# Patient Record
Sex: Female | Born: 1990 | Race: White | Hispanic: No | Marital: Married | Smoking: Current every day smoker
Health system: Southern US, Community
[De-identification: ages and names within clinical notes are randomized; demographics above are authoritative.]

## PROBLEM LIST (undated history)

## (undated) DIAGNOSIS — I10 Essential (primary) hypertension: Secondary | ICD-10-CM

## (undated) DIAGNOSIS — M719 Bursopathy, unspecified: Secondary | ICD-10-CM

## (undated) DIAGNOSIS — M1990 Unspecified osteoarthritis, unspecified site: Secondary | ICD-10-CM

## (undated) DIAGNOSIS — I4581 Long QT syndrome: Secondary | ICD-10-CM

## (undated) HISTORY — PX: CHOLECYSTECTOMY: SHX55

---
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
## 2009-02-05 ENCOUNTER — Emergency Department: Payer: Self-pay | Admitting: Emergency Medicine

## 2009-05-25 ENCOUNTER — Emergency Department: Payer: Self-pay | Admitting: Emergency Medicine

## 2009-07-12 ENCOUNTER — Emergency Department: Payer: Self-pay | Admitting: Emergency Medicine

## 2009-07-22 ENCOUNTER — Ambulatory Visit: Payer: Self-pay | Admitting: Cardiovascular Disease

## 2010-01-25 ENCOUNTER — Emergency Department: Payer: Self-pay | Admitting: Unknown Physician Specialty

## 2010-03-05 ENCOUNTER — Emergency Department: Payer: Self-pay | Admitting: Unknown Physician Specialty

## 2010-06-11 ENCOUNTER — Emergency Department: Payer: Self-pay | Admitting: Emergency Medicine

## 2010-11-30 ENCOUNTER — Emergency Department: Payer: Self-pay | Admitting: *Deleted

## 2010-12-09 ENCOUNTER — Emergency Department: Payer: Self-pay | Admitting: *Deleted

## 2011-05-25 ENCOUNTER — Emergency Department: Payer: Self-pay | Admitting: Emergency Medicine

## 2017-06-26 ENCOUNTER — Emergency Department (HOSPITAL_COMMUNITY): Payer: BLUE CROSS/BLUE SHIELD

## 2017-06-26 ENCOUNTER — Other Ambulatory Visit: Payer: Self-pay

## 2017-06-26 ENCOUNTER — Emergency Department (HOSPITAL_COMMUNITY)
Admission: EM | Admit: 2017-06-26 | Discharge: 2017-06-26 | Disposition: A | Discharge status: Home / Self Care | Payer: BLUE CROSS/BLUE SHIELD | Attending: Emergency Medicine | Admitting: Emergency Medicine

## 2017-06-26 ENCOUNTER — Encounter (HOSPITAL_COMMUNITY): Payer: Self-pay | Admitting: Emergency Medicine

## 2017-06-26 DIAGNOSIS — I10 Essential (primary) hypertension: Secondary | ICD-10-CM | POA: Diagnosis not present

## 2017-06-26 DIAGNOSIS — R1084 Generalized abdominal pain: Secondary | ICD-10-CM | POA: Diagnosis present

## 2017-06-26 DIAGNOSIS — R109 Unspecified abdominal pain: Secondary | ICD-10-CM

## 2017-06-26 DIAGNOSIS — F17210 Nicotine dependence, cigarettes, uncomplicated: Secondary | ICD-10-CM | POA: Insufficient documentation

## 2017-06-26 HISTORY — DX: Essential (primary) hypertension: I10

## 2017-06-26 HISTORY — DX: Bursopathy, unspecified: M71.9

## 2017-06-26 HISTORY — DX: Unspecified osteoarthritis, unspecified site: M19.90

## 2017-06-26 HISTORY — DX: Long QT syndrome: I45.81

## 2017-06-26 LAB — URINALYSIS, ROUTINE W REFLEX MICROSCOPIC
Bilirubin Urine: NEGATIVE
GLUCOSE, UA: NEGATIVE mg/dL
Hgb urine dipstick: NEGATIVE
Ketones, ur: NEGATIVE mg/dL
LEUKOCYTES UA: NEGATIVE
NITRITE: NEGATIVE
Protein, ur: NEGATIVE mg/dL
SPECIFIC GRAVITY, URINE: 1.021 (ref 1.005–1.030)
pH: 6 (ref 5.0–8.0)

## 2017-06-26 LAB — BASIC METABOLIC PANEL
ANION GAP: 9 (ref 5–15)
BUN: 11 mg/dL (ref 6–20)
CALCIUM: 9.2 mg/dL (ref 8.9–10.3)
CO2: 24 mmol/L (ref 22–32)
Chloride: 104 mmol/L (ref 101–111)
Creatinine, Ser: 0.61 mg/dL (ref 0.44–1.00)
GFR calc Af Amer: >60 mL/min (ref >60)
GLUCOSE: 102 mg/dL — AB (ref 65–99)
POTASSIUM: 4.1 mmol/L (ref 3.5–5.1)
SODIUM: 137 mmol/L (ref 135–145)

## 2017-06-26 LAB — PREGNANCY, URINE: Preg Test, Ur: NEGATIVE

## 2017-06-26 MED ORDER — HYDROMORPHONE HCL 1 MG/ML IJ SOLN
0.5000 mg | Freq: Once | INTRAMUSCULAR | Status: AC
Start: 2017-06-26 — End: 2017-06-26
  Administered 2017-06-26: 0.5 mg via INTRAVENOUS
  Filled 2017-06-26: qty 1

## 2017-06-26 MED ORDER — IOPAMIDOL (ISOVUE-300) INJECTION 61%
100.0000 mL | Freq: Once | INTRAVENOUS | Status: AC | PRN
  Administered 2017-06-26: 100 mL via INTRAVENOUS

## 2017-06-26 MED ORDER — ONDANSETRON HCL 4 MG/2ML IJ SOLN
4.0000 mg | Freq: Once | INTRAMUSCULAR | Status: AC
  Administered 2017-06-26: 4 mg via INTRAVENOUS
  Filled 2017-06-26: qty 2

## 2017-06-26 NOTE — ED Triage Notes (Signed)
Pt reports bilateral flank pain since this am. Pt reports pain started after initiating "doxepin". Pt reports has recently had kidney function test and reports abnormal sodium and chloride. Pt denies any abd pain, fever, n/v/d.

## 2017-06-26 NOTE — Discharge Instructions (Signed)
Your vital signs are within normal limits.  Your oxygen level is 100% on room air.  Within normal limits by my interpretation.  Your urine analysis and urine pregnancy test are negative.  Your CT scan of the abdomen and pelvis are negative for acute problems.  Please discuss your flank pain with your primary physician as well as your rheumatology specialist.  Heating pad may be helpful.  Please return to the emergency department if any emergent changes in your condition, problems, or concerns.

## 2017-06-26 NOTE — ED Provider Notes (Signed)
[facility] EMERGENCY DEPARTMENT Provider Note   CSN: [id number] Arrival date & time: 06/26/17  0708     History   Chief Complaint Chief Complaint  Patient presents with  . Flank Pain    HPI Phyllis Carrillo is a 27 y.o. female.  Patient is a 27 year old female who presents to the emergency department with complaint of bilateral flank pain.  The patient stated this problem started early this morning.  Patient states that she recently started a medication called doxepin, she went to urinate this morning and she had severe pain that was almost incapacitating in both right and left flank.  She has not had any recent injury or trauma to the area.  She has not had any high fever to be reported.  Patient states she is on a lot of medications that can have an effect on her kidneys.  She also states that some recent kidney function testing had abnormalities about the sodium and chloride.  The patient denies fever.  She denies nausea or vomiting.      Past Medical History:  Diagnosis Date  . Arthritis   . Bursitis   . Hypertension   . Long Q-T syndrome     There are no active problems to display for this patient.   Past Surgical History:  Procedure Laterality Date  . CHOLECYSTECTOMY      OB History    No data available       Home Medications    Prior to Admission medications   Not on File    Family History History reviewed. No pertinent family history.  Social History Social History   Tobacco Use  . Smoking status: Current Every Day Smoker    Packs/day: 0.50  . Smokeless tobacco: Never Used  Substance Use Topics  . Alcohol use: No    Frequency: Never  . Drug use: No     Allergies   Voltaren [diclofenac sodium]   Review of Systems Review of Systems  Constitutional: Negative for activity change.       All ROS Neg except as noted in HPI  HENT: Negative for nosebleeds.   Eyes: Negative for photophobia and discharge.  Respiratory: Negative for cough,  shortness of breath and wheezing.   Cardiovascular: Negative for chest pain and palpitations.  Gastrointestinal: Positive for abdominal pain. Negative for blood in stool.  Genitourinary: Positive for dysuria. Negative for frequency and hematuria.  Musculoskeletal: Negative for arthralgias, back pain and neck pain.  Skin: Negative.   Neurological: Negative for dizziness, seizures and speech difficulty.  Psychiatric/Behavioral: Negative for confusion and hallucinations.     Physical Exam Updated Vital Signs BP 138/81 (BP Location: Right Arm)   Pulse 87   Temp 98.5 F (36.9 C) (Oral)   Resp 18   Ht 5\' 7"  (1.702 m)   Wt 115.7 kg (255 lb)   LMP 06/19/2017   SpO2 96%   BMI 39.94 kg/m   Physical Exam  Constitutional: She is oriented to person, place, and time. She appears well-developed and well-nourished.  Non-toxic appearance.  HENT:  Head: Normocephalic.  Right Ear: Tympanic membrane and external ear normal.  Left Ear: Tympanic membrane and external ear normal.  Eyes: EOM and lids are normal. Pupils are equal, round, and reactive to light.  Neck: Normal range of motion. Neck supple. Carotid bruit is not present.  Cardiovascular: Normal rate, regular rhythm, normal heart sounds, intact distal pulses and normal pulses.  Pulmonary/Chest: Breath sounds normal. No respiratory distress.  Abdominal: Soft. Bowel sounds are normal. There is no tenderness. There is no guarding.  Right and left CVA tenderness.  Mild discomfort over the suprapubic area.  Musculoskeletal: Normal range of motion.  Bilateral paraspinal tenderness.  Tenderness of the lower back with change of position.  Radial pulses are 2+.  Dorsalis pedis pulses are 2+.  Capillary refill is less than 2 seconds.  Lymphadenopathy:       Head (right side): No submandibular adenopathy present.       Head (left side): No submandibular adenopathy present.    She has no cervical adenopathy.  Neurological: She is alert and  oriented to person, place, and time. She has normal strength. No cranial nerve deficit or sensory deficit.  Skin: Skin is warm and dry.  Psychiatric: She has a normal mood and affect. Her speech is normal.  Nursing note and vitals reviewed.    ED Treatments / Results  Labs (all labs ordered are listed, but only abnormal results are displayed) Labs Reviewed  URINALYSIS, ROUTINE W REFLEX MICROSCOPIC  PREGNANCY, URINE    EKG  EKG Interpretation None       Radiology No results found.  Procedures Procedures (including critical care time)  Medications Ordered in ED Medications - No data to display   Initial Impression / Assessment and Plan / ED Course  I have reviewed the triage vital signs and the nursing notes.  Pertinent labs & imaging results that were available during my care of the patient were reviewed by me and considered in my medical decision making (see chart for details).       Final Clinical Impressions(s) / ED Diagnoses  MDM  Vital signs are within normal limits.  Patient is concerned that the doxepin may have caused her discomfort, but she is only taken 1 or 2 doses of this medication.  She did not have an immediate negative response when first taking the medication.  Urine pregnancy is negative, urine analysis is negative for infection or signs of kidney stone.  Patient treated in the emergency department with intravenous Dilaudid 0.5 mg.  Basic metabolic panel is well within normal limits.  CT scan of the abdomen and pelvis with contrast is negative for acute problem.  No evidence of any lower lung pneumonia, no evidence of aneurysm, no evidence of pyelonephritis radiographically, no evidence for kidney stone, no evidence for abscess, and no abdominal or pelvic mass appreciated.  I discussed the clinical findings as well as the CT findings with the patient in terms of which she understands.  Questions were answered.  I have asked the patient to see her  primary physicians at [facility] to complete the workup of her flank area pain.  Patient is to return to the emergency department if any emergent changes, problems, or concerns.   Final diagnoses:  Bilateral flank pain    ED Discharge Orders    None       Ivery Quale, Cordelia Poche 06/26/17 1203    Donnetta Hutching, MD 06/27/17 [phone]

## 2017-09-15 ENCOUNTER — Emergency Department (HOSPITAL_COMMUNITY)
Admission: EM | Admit: 2017-09-15 | Discharge: 2017-09-15 | Disposition: A | Discharge status: Home / Self Care | Payer: BLUE CROSS/BLUE SHIELD | Attending: Emergency Medicine | Admitting: Emergency Medicine

## 2017-09-15 ENCOUNTER — Other Ambulatory Visit: Payer: Self-pay

## 2017-09-15 ENCOUNTER — Encounter (HOSPITAL_COMMUNITY): Payer: Self-pay | Admitting: Emergency Medicine

## 2017-09-15 DIAGNOSIS — Y33XXXA Other specified events, undetermined intent, initial encounter: Secondary | ICD-10-CM | POA: Insufficient documentation

## 2017-09-15 DIAGNOSIS — M5441 Lumbago with sciatica, right side: Secondary | ICD-10-CM

## 2017-09-15 DIAGNOSIS — Y998 Other external cause status: Secondary | ICD-10-CM | POA: Diagnosis not present

## 2017-09-15 DIAGNOSIS — Y939 Activity, unspecified: Secondary | ICD-10-CM | POA: Diagnosis not present

## 2017-09-15 DIAGNOSIS — Y929 Unspecified place or not applicable: Secondary | ICD-10-CM | POA: Insufficient documentation

## 2017-09-15 DIAGNOSIS — I10 Essential (primary) hypertension: Secondary | ICD-10-CM | POA: Diagnosis not present

## 2017-09-15 DIAGNOSIS — M545 Low back pain: Secondary | ICD-10-CM | POA: Diagnosis present

## 2017-09-15 DIAGNOSIS — S39012A Strain of muscle, fascia and tendon of lower back, initial encounter: Secondary | ICD-10-CM | POA: Insufficient documentation

## 2017-09-15 DIAGNOSIS — F17200 Nicotine dependence, unspecified, uncomplicated: Secondary | ICD-10-CM | POA: Diagnosis not present

## 2017-09-15 MED ORDER — DEXAMETHASONE 4 MG PO TABS: 4.0000 mg | ORAL_TABLET | Freq: Two times a day (BID) | ORAL | 0 refills | Status: DC

## 2017-09-15 MED ORDER — TRAMADOL HCL 50 MG PO TABS: ORAL_TABLET | ORAL | 0 refills | Status: DC

## 2017-09-15 MED ORDER — ONDANSETRON HCL 4 MG PO TABS
4.0000 mg | ORAL_TABLET | Freq: Once | ORAL | Status: AC
  Administered 2017-09-15: 4 mg via ORAL
  Filled 2017-09-15: qty 1

## 2017-09-15 MED ORDER — PREDNISONE 20 MG PO TABS
40.0000 mg | ORAL_TABLET | Freq: Once | ORAL | Status: AC
  Administered 2017-09-15: 40 mg via ORAL
  Filled 2017-09-15: qty 2

## 2017-09-15 MED ORDER — HYDROCODONE-ACETAMINOPHEN 5-325 MG PO TABS
2.0000 | ORAL_TABLET | Freq: Once | ORAL | Status: AC
  Administered 2017-09-15: 2 via ORAL
  Filled 2017-09-15: qty 2

## 2017-09-15 MED ORDER — CYCLOBENZAPRINE HCL 10 MG PO TABS
10.0000 mg | ORAL_TABLET | Freq: Once | ORAL | Status: AC
  Administered 2017-09-15: 10 mg via ORAL
  Filled 2017-09-15: qty 1

## 2017-09-15 NOTE — Discharge Instructions (Signed)
Your examination favors muscle strain involving your lower back.  There is also some midline pain and a history of numbness going down the side of the leg and down to the foot.  This may represent sciatica.  Please discuss this with your orthopedic or your neurosurgery specialist.  Heating pad to the area may be helpful.  Continue your current medications with Celebrex and Robaxin.  Please add Decadron and Ultram to your medications.

## 2017-09-15 NOTE — ED Triage Notes (Signed)
Pt states she was leaning down and felt a pull in her lower back and R hip. Denies bowel/bladder incontinence. Pt ambulatory to triage. Robaxin and Celebrex PTA with no relief. Has known hx of back and hip problems

## 2017-09-15 NOTE — ED Provider Notes (Signed)
[facility] EMERGENCY DEPARTMENT Provider Note   CSN: [id number] Arrival date & time: 09/15/17  1443     History   Chief Complaint Chief Complaint  Patient presents with  . Hip Pain    HPI Phyllis Carrillo is a 27 y.o. female.  Patient is a 27 year old female who presents to the emergency department with a complaint of right lower back area pain.  Patient has a history of ankylosing spondylitis arthritis, and bursitis. Patient states that she has some problems from time to time with her back and hip.  She bent over to pick up an object and felt a severe pull in her lower back and then had pain from the back into the right hip.  No other injury reported.  No fall noted.  The year was no loss of control of bowel or bladder.  Patient has been able to walk, but has had to be very slow because of problem with pain.  Patient states she is tried a Celebrex and a muscle relaxer, but this had very little impact on her pain.  She presents now for assistance with this issue.     Past Medical History:  Diagnosis Date  . Arthritis   . Bursitis   . Hypertension   . Long Q-T syndrome     There are no active problems to display for this patient.   Past Surgical History:  Procedure Laterality Date  . CHOLECYSTECTOMY       OB History   None      Home Medications    Prior to Admission medications   Not on File    Family History History reviewed. No pertinent family history.  Social History Social History   Tobacco Use  . Smoking status: Current Every Day Smoker    Packs/day: 0.50  . Smokeless tobacco: Never Used  Substance Use Topics  . Alcohol use: No    Frequency: Never  . Drug use: Yes    Types: Marijuana    Comment: 09/15/17     Allergies   Voltaren [diclofenac sodium]   Review of Systems Review of Systems  Constitutional: Negative for activity change.       All ROS Neg except as noted in HPI  HENT: Negative for nosebleeds.   Eyes: Negative for  photophobia and discharge.  Respiratory: Negative for cough, shortness of breath and wheezing.   Cardiovascular: Negative for chest pain and palpitations.  Gastrointestinal: Negative for abdominal pain and blood in stool.  Genitourinary: Negative for dysuria, frequency and hematuria.  Musculoskeletal: Positive for arthralgias and back pain. Negative for neck pain.       Hip pain  Skin: Negative.   Neurological: Negative for dizziness, seizures and speech difficulty.  Psychiatric/Behavioral: Negative for confusion and hallucinations.     Physical Exam Updated Vital Signs BP 137/75 (BP Location: Right Arm)   Pulse 73   Temp 97.8 F (36.6 C) (Oral)   Resp 18   Ht  (1.702 m)   Wt 115.7 kg (255 lb)   LMP 09/13/2017   SpO2 100%   BMI 39.94 kg/m   Physical Exam  Constitutional: She is oriented to person, place, and time. She appears well-developed and well-nourished.  Non-toxic appearance.  HENT:  Head: Normocephalic.  Right Ear: Tympanic membrane and external ear normal.  Left Ear: Tympanic membrane and external ear normal.  Eyes: Pupils are equal, round, and reactive to light. EOM and lids are normal.  Neck: Normal range of motion. Neck  supple. Carotid bruit is not present.  Cardiovascular: Normal rate, regular rhythm, normal heart sounds, intact distal pulses and normal pulses.  Pulmonary/Chest: Breath sounds normal. No respiratory distress.  Abdominal: Soft. Bowel sounds are normal. There is no tenderness. There is no guarding.  Musculoskeletal:       Lumbar back: She exhibits decreased range of motion, pain and spasm.       Back:  Lymphadenopathy:       Head (right side): No submandibular adenopathy present.       Head (left side): No submandibular adenopathy present.    She has no cervical adenopathy.  Neurological: She is alert and oriented to person, place, and time. She has normal strength. No cranial nerve deficit or sensory deficit.  Skin: Skin is warm and dry.   Psychiatric: She has a normal mood and affect. Her speech is normal.  Nursing note and vitals reviewed.    ED Treatments / Results  Labs (all labs ordered are listed, but only abnormal results are displayed) Labs Reviewed - No data to display  EKG None  Radiology No results found.  Procedures Procedures (including critical care time)  Medications Ordered in ED Medications  HYDROcodone-acetaminophen (NORCO/VICODIN) 5-325 MG per tablet 2 tablet (has no administration in time range)  predniSONE (DELTASONE) tablet 40 mg (has no administration in time range)  cyclobenzaprine (FLEXERIL) tablet 10 mg (has no administration in time range)  ondansetron (ZOFRAN) tablet 4 mg (has no administration in time range)     Initial Impression / Assessment and Plan / ED Course  I have reviewed the triage vital signs and the nursing notes.  Pertinent labs & imaging results that were available during my care of the patient were reviewed by me and considered in my medical decision making (see chart for details).       Final Clinical Impressions(s) / ED Diagnoses MDM  Vital signs within normal limits.  Pulse oximetry is 100% on room air.  Within normal limits by my interpretation.  No gross neurologic deficit appreciated on examination.  No evidence for cauda equina or other emergent changes.  The examination favors muscle strain involving the paraspinal lower lumbar area.  The patient does have some pain in the lower midline lumbar area.  I have asked her to discuss this with her orthopedic specialist for possible MRI evaluation.  In the interim, the patient will continue the Robaxin and Celebrex.  We will add steroids and a short course of Ultram.   Final diagnoses:  Lumbar strain, initial encounter  Low back pain with right-sided sciatica, unspecified back pain laterality, unspecified chronicity    ED Discharge Orders        Ordered    dexamethasone (DECADRON) 4 MG tablet  2 times  daily with meals     09/15/17 1635    traMADol (ULTRAM) 50 MG tablet     09/15/17 1635       Ivery Quale, PA-C 09/15/17 1636    Terrilee Files, MD 09/17/17 1059

## 2018-03-12 IMAGING — CT CT ABD-PELV W/ CM
2 of 4 series · 16 of 46 positions shown, 18 images · IV contrast (iopamidol)
Comparison: None.

CLINICAL DATA: Bilateral flank pain since this morning. Prior
cholecystectomy.

EXAM:
CT ABDOMEN AND PELVIS WITH CONTRAST
TECHNIQUE: Multidetector CT imaging of the abdomen and pelvis was performed
using the standard protocol following bolus administration of
intravenous contrast.
CONTRAST:  100mL [id number] IOPAMIDOL ([id number]-FII) INJECTION 61%

[Series 2: axial st · axial · 0.77mm/px · z∈[+1583,+1998]mm · 13 of 94 slices shown, 15 images]
[im 7/94  soft-tissue]
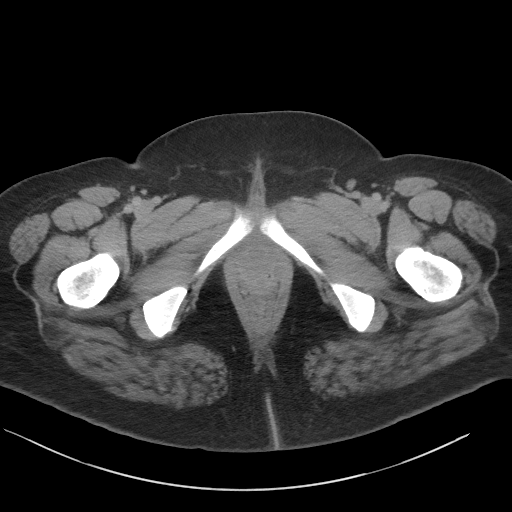
[im 7/94  bone]
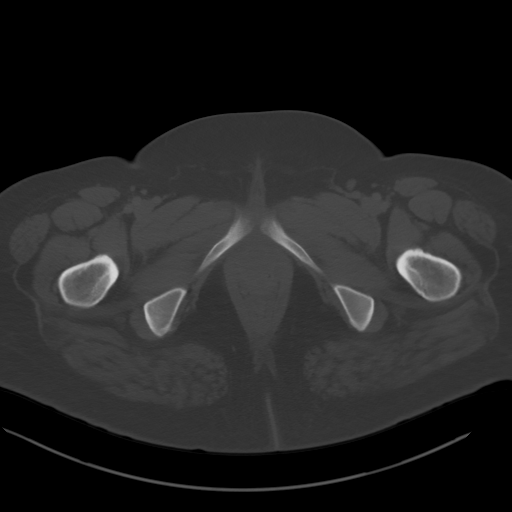
[im 14/94  soft-tissue]
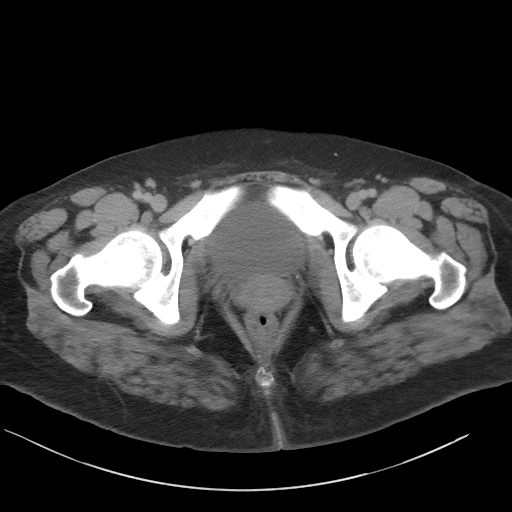
[im 21/94  soft-tissue]
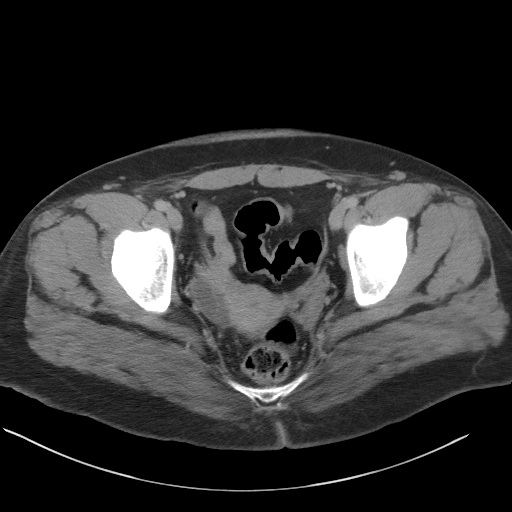
[im 28/94  soft-tissue]
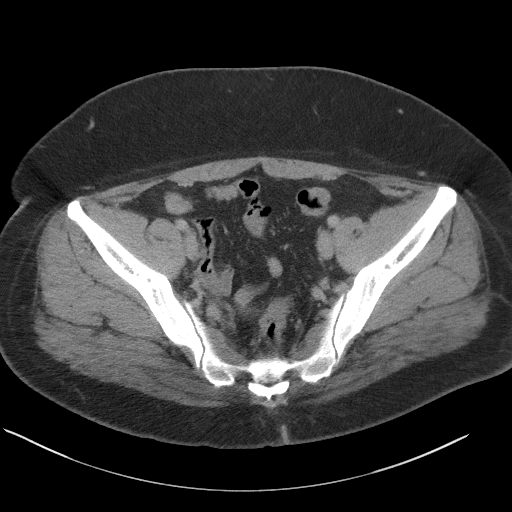
[im 35/94  soft-tissue]
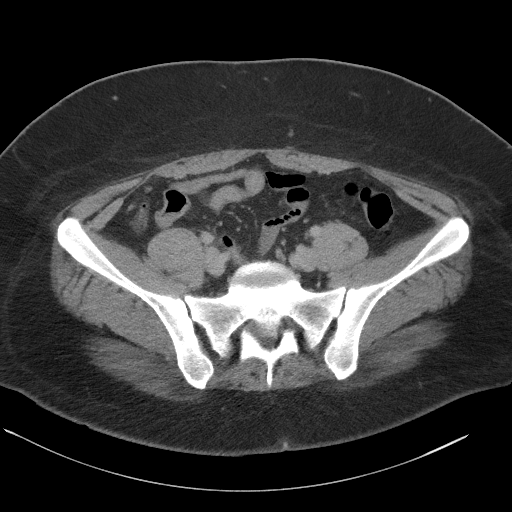
[im 42/94  soft-tissue]
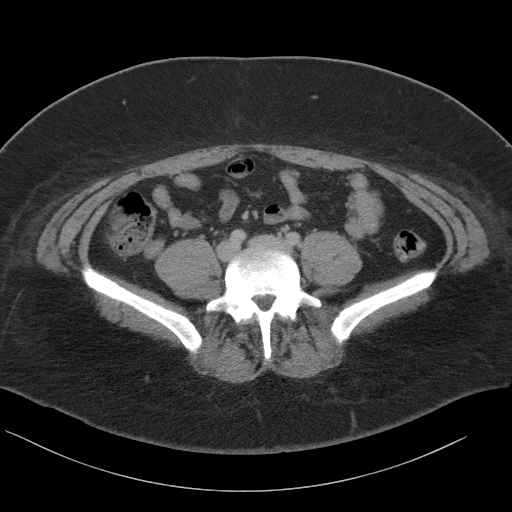
[im 49/94  soft-tissue]
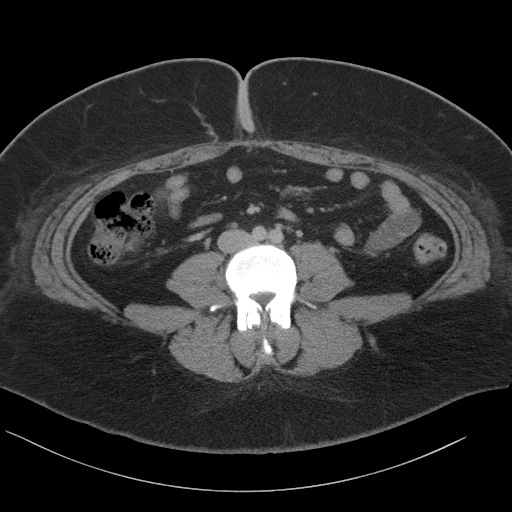
[im 56/94  soft-tissue]
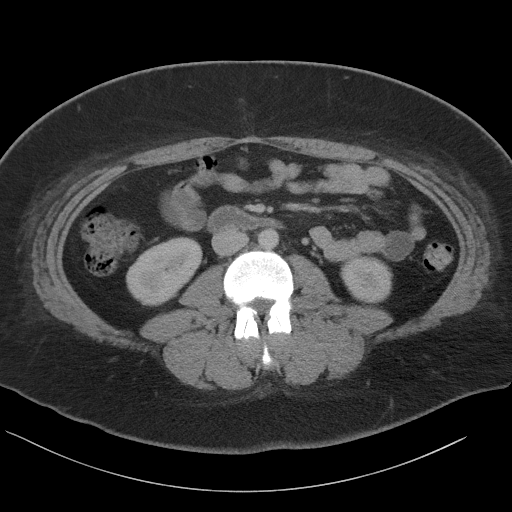
[im 63/94  soft-tissue]
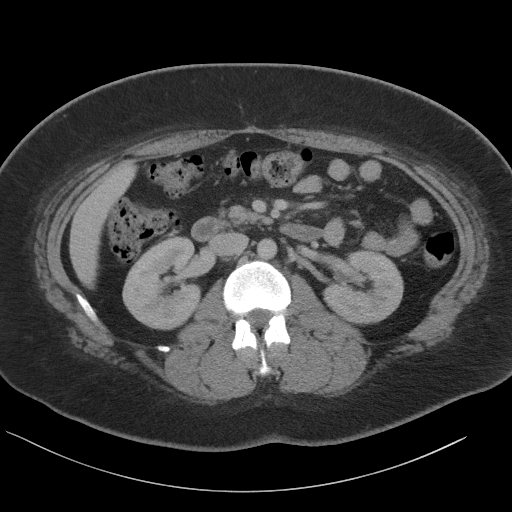
[im 63/94  bone]
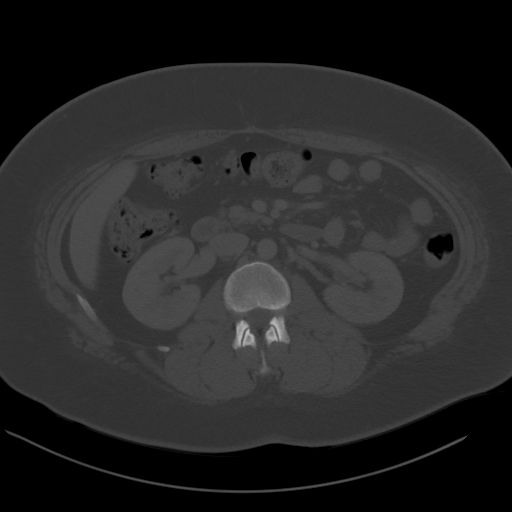
[im 69/94  soft-tissue]
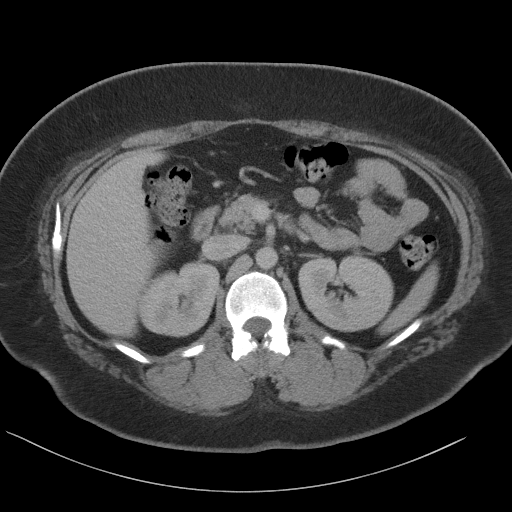
[im 76/94  soft-tissue]
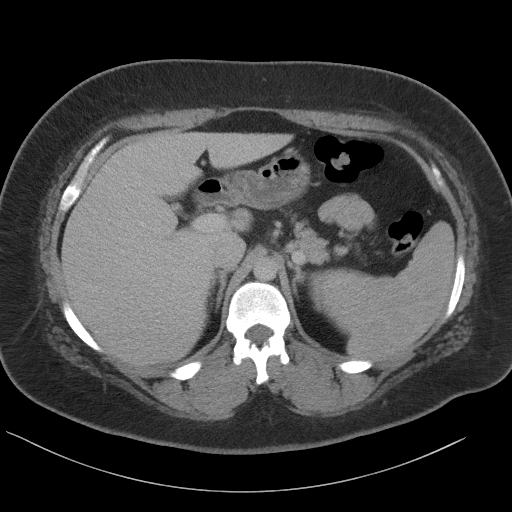
[im 83/94  soft-tissue]
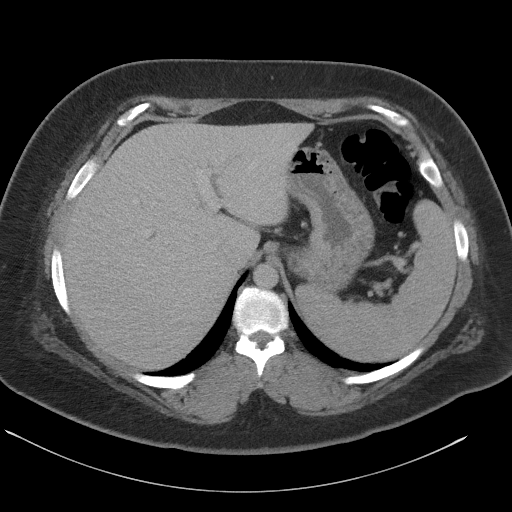
[im 90/94  soft-tissue]
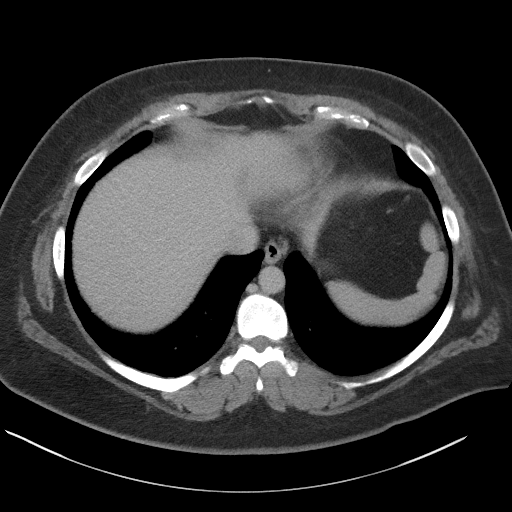

[Series 5: coronal st · coronal · 0.82mm/px · 3 of 117 slices shown]
[im 39/117  soft-tissue]
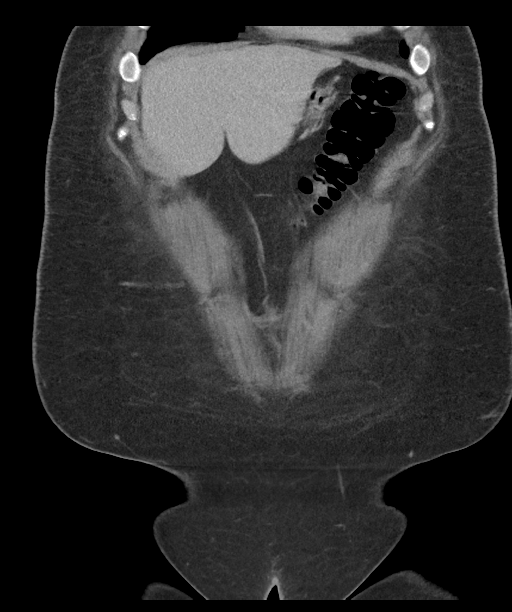
[im 52/117  soft-tissue]
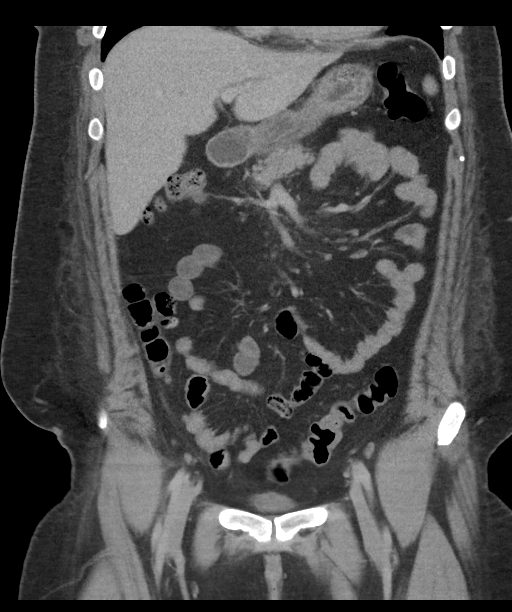
[im 65/117  soft-tissue]
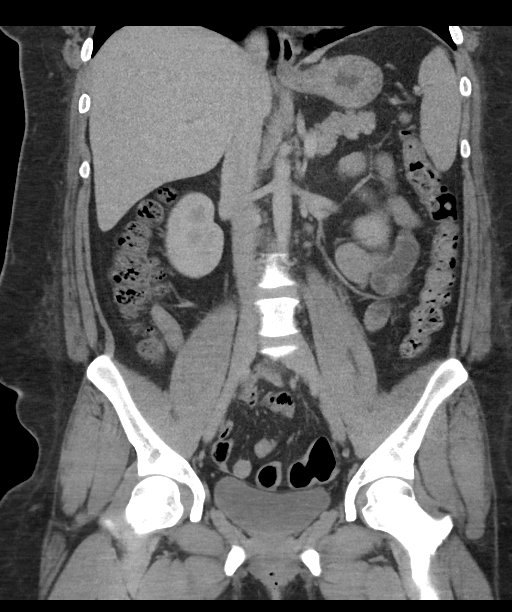

[16 of 46 positions shown; findings below may reference images not displayed]

FINDINGS: Lower chest: No significant pulmonary nodules or acute consolidative
airspace disease.

Hepatobiliary: Normal liver size. No liver mass. Cholecystectomy. No
biliary ductal dilatation. Common bile duct diameter 5 mm.

Pancreas: Normal, with no mass or duct dilation.

Spleen: Normal size. No mass.

Adrenals/Urinary Tract: Normal adrenals. Normal kidneys with no
hydronephrosis and no renal mass. Normal bladder.

Stomach/Bowel: Normal non-distended stomach. Normal caliber small
bowel with no small bowel wall thickening. Normal appendix. Normal
large bowel with no diverticulosis, large bowel wall thickening or
pericolonic fat stranding.

Vascular/Lymphatic: Normal caliber abdominal aorta. Patent portal,
splenic, hepatic and renal veins. No pathologically enlarged lymph
nodes in the abdomen or pelvis.

Reproductive: Grossly normal uterus. Simple 2.6 cm right adnexal
cyst, for which no follow-up is required ( This recommendation
follows ACR consensus guidelines: White Paper of the ACR Incidental
Findings Committee II on Adnexal Findings. [HOSPITAL]
[DATE]). No left adnexal mass.

Other: No pneumoperitoneum, ascites or focal fluid collection.

Musculoskeletal: No aggressive appearing focal osseous lesions.
IMPRESSION: No acute abnormality. No hydronephrosis. No evidence of bowel
obstruction or acute bowel inflammation. Normal appendix.

## 2018-05-12 ENCOUNTER — Other Ambulatory Visit: Payer: Self-pay

## 2018-05-12 ENCOUNTER — Emergency Department (HOSPITAL_COMMUNITY): Payer: Self-pay

## 2018-05-12 ENCOUNTER — Emergency Department (HOSPITAL_COMMUNITY)
Admission: EM | Admit: 2018-05-12 | Discharge: 2018-05-12 | Disposition: A | Discharge status: Home / Self Care | Payer: Self-pay | Attending: Emergency Medicine | Admitting: Emergency Medicine

## 2018-05-12 DIAGNOSIS — Z5321 Procedure and treatment not carried out due to patient leaving prior to being seen by health care provider: Secondary | ICD-10-CM | POA: Insufficient documentation

## 2018-05-12 DIAGNOSIS — R0602 Shortness of breath: Secondary | ICD-10-CM | POA: Insufficient documentation

## 2018-06-09 ENCOUNTER — Ambulatory Visit (INDEPENDENT_AMBULATORY_CARE_PROVIDER_SITE_OTHER): Payer: Self-pay | Admitting: Internal Medicine

## 2018-06-09 ENCOUNTER — Encounter (INDEPENDENT_AMBULATORY_CARE_PROVIDER_SITE_OTHER): Payer: Self-pay | Admitting: Internal Medicine

## 2018-06-09 VITALS — BP 143/93 | HR 76 | Temp 98.3°F | Ht 67.0 in | Wt 286.3 lb

## 2018-06-09 DIAGNOSIS — K219 Gastro-esophageal reflux disease without esophagitis: Secondary | ICD-10-CM

## 2018-06-09 DIAGNOSIS — K588 Other irritable bowel syndrome: Secondary | ICD-10-CM

## 2018-06-09 MED ORDER — OMEPRAZOLE 20 MG PO CPDR: 20.0000 mg | DELAYED_RELEASE_CAPSULE | Freq: Every day | ORAL | 3 refills | Status: DC

## 2018-06-09 NOTE — Patient Instructions (Signed)
OV in 1 year.  

## 2018-06-09 NOTE — Progress Notes (Signed)
   Subjective:    Patient ID: Phyllis Carrillo, female    DOB: 1991/02/12, 28 y.o.   MRN: [id number]  HPI Referred by Dr. Joaquin Music for gastritis. She is taking Omeprzole for her GERD. She says when she eats, she will start having cramps in lower abdomen when she has a BM. She says since stopping the Celebrex she feels so much better. She rarely has abdominal cramp since stopping the Celebrex.She takes the Bentyl 4 times a month.  She rarely eats red meat. No weight loss.  Her appetite is good.  Really no GERD.  She does occasionally have acid reflux.  Has seen Dr. Glean Hess recently (06/18/2017) for nausea, H. Pylori positive.  She underwent and EGD which was positive for H. Pylori, negative for gastritis. Also she underwent a colonoscopy in 2018.  She also has IBS and takes Bentyl for this. (Result of EGD were in notes at [facility]. I do not see the EGD or Colonoscopy report.   Hx Anklyosing Sponditis and takes Humira and Celebrex     Review of Systems Past Medical History:  Diagnosis Date  . Arthritis   . Bursitis   . Hypertension   . Long Q-T syndrome     Past Surgical History:  Procedure Laterality Date  . CHOLECYSTECTOMY      Allergies  Allergen Reactions  . Voltaren [Diclofenac Sodium]     Rash     Current Outpatient Medications on File Prior to Visit  Medication Sig Dispense Refill  . Adalimumab (HUMIRA) 40 MG/0.4ML PSKT Inject into the skin. Every 2 weeks.    Marland Kitchen buPROPion (WELLBUTRIN) 100 MG tablet Take 100 mg by mouth 2 (two) times daily.    . celecoxib (CELEBREX) 100 MG capsule Take 100 mg by mouth daily.    Marland Kitchen dicyclomine (BENTYL) 10 MG capsule Take 10 mg by mouth as needed for spasms.    Marland Kitchen doxepin (SINEQUAN) 25 MG capsule Take 25 mg by mouth.    . DULoxetine (CYMBALTA) 60 MG capsule Take 60 mg by mouth daily.    . hydrOXYzine (ATARAX/VISTARIL) 25 MG tablet Take 25 mg by mouth as needed.    . metoprolol tartrate (LOPRESSOR) 25 MG tablet Take 25 mg by mouth daily.     Marland Kitchen omeprazole (PRILOSEC) 20 MG capsule Take 20 mg by mouth daily.    . Potassium 99 MG TABS Take by mouth.    Marland Kitchen VITAMIN D, CHOLECALCIFEROL, PO Take 2,000 mg by mouth.     No current facility-administered medications on file prior to visit.         Objective:   Physical Exam Blood pressure (!) 143/93, pulse 76, temperature 98.3 F (36.8 C), height 5\' 7"  (1.702 m), weight 286 lb 4.8 oz (129.9 kg). Alert and oriented. Skin warm and dry. Oral mucosa is moist.   . Sclera anicteric, conjunctivae is pink. Thyroid not enlarged. No cervical lymphadenopathy. Lungs clear. Heart regular rate and rhythm.  Abdomen is soft. Bowel sounds are positive. No hepatomegaly. No abdominal masses felt. No tenderness.  No edema to lower extremities.          Assessment & Plan:  GERD. She will continue the Omeprazole. IBS: She will continue the Bentyl as needed. OV in 1 year.

## 2019-06-12 ENCOUNTER — Ambulatory Visit (INDEPENDENT_AMBULATORY_CARE_PROVIDER_SITE_OTHER): Payer: Self-pay | Admitting: Internal Medicine

## 2019-07-27 ENCOUNTER — Other Ambulatory Visit (INDEPENDENT_AMBULATORY_CARE_PROVIDER_SITE_OTHER): Payer: Self-pay | Admitting: *Deleted

## 2019-07-27 DIAGNOSIS — K219 Gastro-esophageal reflux disease without esophagitis: Secondary | ICD-10-CM

## 2019-07-27 MED ORDER — OMEPRAZOLE 20 MG PO CPDR: 20.0000 mg | DELAYED_RELEASE_CAPSULE | Freq: Every day | ORAL | 3 refills | Status: DC

## 2019-10-31 ENCOUNTER — Telehealth (INDEPENDENT_AMBULATORY_CARE_PROVIDER_SITE_OTHER): Payer: Self-pay | Admitting: Gastroenterology

## 2019-10-31 DIAGNOSIS — K219 Gastro-esophageal reflux disease without esophagitis: Secondary | ICD-10-CM

## 2019-10-31 MED ORDER — OMEPRAZOLE 20 MG PO CPDR: 20.0000 mg | DELAYED_RELEASE_CAPSULE | Freq: Every day | ORAL | 0 refills | Status: AC

## 2019-10-31 NOTE — Telephone Encounter (Signed)
Patient is due for yearly office visit - refills sent to pharmacy but will need to be seen for additional refills.

## 2019-11-08 ENCOUNTER — Ambulatory Visit (INDEPENDENT_AMBULATORY_CARE_PROVIDER_SITE_OTHER): Payer: Medicaid Other | Admitting: Gastroenterology

## 2020-02-08 ENCOUNTER — Other Ambulatory Visit (INDEPENDENT_AMBULATORY_CARE_PROVIDER_SITE_OTHER): Payer: Self-pay | Admitting: Gastroenterology

## 2020-02-08 DIAGNOSIS — K219 Gastro-esophageal reflux disease without esophagitis: Secondary | ICD-10-CM

## 2020-02-09 NOTE — Telephone Encounter (Signed)
Patient last seen 06/2018 - needs yearly f/up appt. I will send refills after scheduling OV or she can buy OTC. Thanks.

## 2020-02-12 ENCOUNTER — Telehealth (INDEPENDENT_AMBULATORY_CARE_PROVIDER_SITE_OTHER): Payer: Self-pay

## 2020-02-12 NOTE — Telephone Encounter (Signed)
Left a detailed voicemail for her to call the office to make a f/u appointment before any refills can be made

## 2020-02-12 NOTE — Telephone Encounter (Signed)
Phyllis Carrillo, CMA  

## 2024-04-03 ENCOUNTER — Other Ambulatory Visit: Payer: Self-pay | Admitting: Internal Medicine

## 2024-04-03 DIAGNOSIS — I209 Angina pectoris, unspecified: Secondary | ICD-10-CM

## 2024-04-03 DIAGNOSIS — R079 Chest pain, unspecified: Secondary | ICD-10-CM

## 2024-05-10 ENCOUNTER — Telehealth (HOSPITAL_COMMUNITY): Payer: Self-pay | Admitting: *Deleted

## 2024-05-10 NOTE — Telephone Encounter (Signed)
 Reaching out to patient to offer assistance regarding upcoming cardiac imaging study; pt verbalizes understanding of appt date/time, parking situation and where to check in, pre-test NPO status and medications ordered, and verified current allergies; name and call back number provided for further questions should they arise  Larey Brick RN Navigator Cardiac Imaging [facility] [phone] office [phone] cell  Patient to take 25mg  metoprolol tartrate two hours prior to her cardiac CT scan.

## 2024-05-11 ENCOUNTER — Ambulatory Visit
Admission: RE | Admit: 2024-05-11 | Discharge: 2024-05-11 | Disposition: A | Discharge status: Home / Self Care | Source: Ambulatory Visit | Attending: Internal Medicine | Admitting: Internal Medicine

## 2024-05-11 ENCOUNTER — Ambulatory Visit: Payer: Self-pay

## 2024-05-11 DIAGNOSIS — I209 Angina pectoris, unspecified: Secondary | ICD-10-CM | POA: Insufficient documentation

## 2024-05-11 DIAGNOSIS — R079 Chest pain, unspecified: Secondary | ICD-10-CM | POA: Diagnosis present

## 2024-05-11 MED ORDER — METOPROLOL TARTRATE 5 MG/5ML IV SOLN
10.0000 mg | Freq: Once | INTRAVENOUS | Status: DC | PRN
  Filled 2024-05-11: qty 10

## 2024-05-11 MED ORDER — DILTIAZEM HCL 25 MG/5ML IV SOLN
10.0000 mg | INTRAVENOUS | Status: DC | PRN
  Filled 2024-05-11: qty 5

## 2024-05-11 MED ORDER — NITROGLYCERIN 0.4 MG SL SUBL
0.8000 mg | SUBLINGUAL_TABLET | Freq: Once | SUBLINGUAL | Status: AC
  Administered 2024-05-11: 0.8 mg via SUBLINGUAL

## 2024-05-11 MED ORDER — IOHEXOL 350 MG/ML SOLN
100.0000 mL | Freq: Once | INTRAVENOUS | Status: AC | PRN
  Administered 2024-05-11: 100 mL via INTRAVENOUS

## 2024-05-11 NOTE — Progress Notes (Signed)
 Patient tolerated procedure well. W/C to lobby.  Ambulate w/o difficulty. Denies light headedness or being dizzy. Encouraged to drink extra water today and reasoning explained. Verbalized understanding. All questions answered. ABC intact. No further needs. Discharge from procedure area w/o issues.
